# Patient Record
Sex: Female | Born: 1960
Health system: Southern US, Community
[De-identification: ages and names within clinical notes are randomized; demographics above are authoritative.]

## PROBLEM LIST (undated history)

## (undated) DIAGNOSIS — E785 Hyperlipidemia, unspecified: Secondary | ICD-10-CM

## (undated) DIAGNOSIS — F419 Anxiety disorder, unspecified: Secondary | ICD-10-CM

## (undated) DIAGNOSIS — I10 Essential (primary) hypertension: Secondary | ICD-10-CM

## (undated) DIAGNOSIS — E119 Type 2 diabetes mellitus without complications: Secondary | ICD-10-CM

## (undated) HISTORY — DX: Hyperlipidemia, unspecified: E78.5

## (undated) HISTORY — DX: Type 2 diabetes mellitus without complications: E11.9

## (undated) HISTORY — DX: Essential (primary) hypertension: I10

## (undated) HISTORY — DX: Anxiety disorder, unspecified: F41.9

---

## 2018-04-28 DIAGNOSIS — Z1231 Encounter for screening mammogram for malignant neoplasm of breast: Secondary | ICD-10-CM | POA: Diagnosis not present

## 2018-05-10 ENCOUNTER — Other Ambulatory Visit: Payer: Self-pay | Admitting: Occupational Medicine

## 2018-05-10 ENCOUNTER — Ambulatory Visit: Payer: Self-pay

## 2018-05-10 DIAGNOSIS — M79641 Pain in right hand: Secondary | ICD-10-CM

## 2018-05-13 MED FILL — LISINOPRIL-HCTZ 20-12.5 MG: 20-12.5 | 90 days supply | Qty: 180 | Fill #0

## 2018-05-13 MED FILL — PROPRANOLOL 40 MG TABLET: 40 | 90 days supply | Qty: 270 | Fill #0

## 2018-05-13 MED FILL — metFORMIN HCL 500 MG TABS: 500 | 90 days supply | Qty: 90 | Fill #0

## 2018-06-13 DIAGNOSIS — R6881 Early satiety: Secondary | ICD-10-CM | POA: Diagnosis not present

## 2018-06-13 DIAGNOSIS — R1310 Dysphagia, unspecified: Secondary | ICD-10-CM | POA: Diagnosis not present

## 2018-06-13 DIAGNOSIS — K219 Gastro-esophageal reflux disease without esophagitis: Secondary | ICD-10-CM | POA: Diagnosis not present

## 2018-06-13 MED FILL — OMEPRAZOLE 20 MG CPDR: 20 | 30 days supply | Qty: 30 | Fill #0

## 2018-06-21 DIAGNOSIS — R1310 Dysphagia, unspecified: Secondary | ICD-10-CM | POA: Diagnosis not present

## 2018-06-21 DIAGNOSIS — Z800 Family history of malignant neoplasm of digestive organs: Secondary | ICD-10-CM | POA: Diagnosis not present

## 2018-07-18 MED FILL — OMEPRAZOLE 20 MG CPDR: 20 | 30 days supply | Qty: 30 | Fill #1

## 2018-07-28 DIAGNOSIS — R072 Precordial pain: Secondary | ICD-10-CM | POA: Diagnosis not present

## 2018-07-28 DIAGNOSIS — I10 Essential (primary) hypertension: Secondary | ICD-10-CM | POA: Diagnosis not present

## 2018-07-28 DIAGNOSIS — E119 Type 2 diabetes mellitus without complications: Secondary | ICD-10-CM | POA: Diagnosis not present

## 2018-07-28 DIAGNOSIS — E785 Hyperlipidemia, unspecified: Secondary | ICD-10-CM | POA: Diagnosis not present

## 2018-07-28 DIAGNOSIS — D509 Iron deficiency anemia, unspecified: Secondary | ICD-10-CM | POA: Diagnosis not present

## 2018-07-28 DIAGNOSIS — E8881 Metabolic syndrome: Secondary | ICD-10-CM | POA: Diagnosis not present

## 2018-08-12 MED FILL — ATORVASTATIN 80 MG TABLET: 80 | 90 days supply | Qty: 90 | Fill #0

## 2018-08-12 MED FILL — LISINOPRIL-HCTZ 20-12.5 MG: 20-12.5 | 90 days supply | Qty: 180 | Fill #0

## 2018-08-12 MED FILL — metFORMIN HCL 500 MG TABS: 500 | 30 days supply | Qty: 30 | Fill #1

## 2018-09-19 MED FILL — metFORMIN HCL 500 MG TABS: 500 | 90 days supply | Qty: 90 | Fill #0

## 2018-11-04 MED FILL — PROPRANOLOL 40 MG TABLET: 40 | 90 days supply | Qty: 270 | Fill #0

## 2018-11-04 MED FILL — LISINOPRIL-HCTZ 20-12.5 MG: 20-12.5 | 90 days supply | Qty: 180 | Fill #0

## 2018-11-16 DIAGNOSIS — E1165 Type 2 diabetes mellitus with hyperglycemia: Secondary | ICD-10-CM | POA: Diagnosis not present

## 2018-11-16 DIAGNOSIS — E119 Type 2 diabetes mellitus without complications: Secondary | ICD-10-CM | POA: Diagnosis not present

## 2018-11-16 DIAGNOSIS — E7800 Pure hypercholesterolemia, unspecified: Secondary | ICD-10-CM | POA: Diagnosis not present

## 2018-11-16 DIAGNOSIS — Z79899 Other long term (current) drug therapy: Secondary | ICD-10-CM | POA: Diagnosis not present

## 2018-11-16 DIAGNOSIS — R5383 Other fatigue: Secondary | ICD-10-CM | POA: Diagnosis not present

## 2018-11-16 DIAGNOSIS — I10 Essential (primary) hypertension: Secondary | ICD-10-CM | POA: Diagnosis not present

## 2018-11-16 DIAGNOSIS — E559 Vitamin D deficiency, unspecified: Secondary | ICD-10-CM | POA: Diagnosis not present

## 2018-11-16 DIAGNOSIS — R0602 Shortness of breath: Secondary | ICD-10-CM | POA: Diagnosis not present

## 2018-11-16 MED FILL — PHENTERMINE 37.5 MG TABLET: 37.5 | 30 days supply | Qty: 30 | Fill #0

## 2018-11-24 DIAGNOSIS — Z6838 Body mass index (BMI) 38.0-38.9, adult: Secondary | ICD-10-CM | POA: Diagnosis not present

## 2018-11-24 DIAGNOSIS — E669 Obesity, unspecified: Secondary | ICD-10-CM | POA: Diagnosis not present

## 2018-11-24 DIAGNOSIS — N183 Chronic kidney disease, stage 3 (moderate): Secondary | ICD-10-CM | POA: Diagnosis not present

## 2018-11-24 DIAGNOSIS — E1165 Type 2 diabetes mellitus with hyperglycemia: Secondary | ICD-10-CM | POA: Diagnosis not present

## 2018-11-24 MED FILL — metFORMIN HCL 500 MG TABS: 500 | 30 days supply | Qty: 60 | Fill #0

## 2018-12-07 DIAGNOSIS — M62838 Other muscle spasm: Secondary | ICD-10-CM | POA: Diagnosis not present

## 2018-12-07 DIAGNOSIS — I10 Essential (primary) hypertension: Secondary | ICD-10-CM | POA: Diagnosis not present

## 2018-12-07 MED FILL — CYCLOBENZAPRINE HCL 10 MG T: 10 | 10 days supply | Qty: 20 | Fill #0

## 2018-12-14 DIAGNOSIS — E1165 Type 2 diabetes mellitus with hyperglycemia: Secondary | ICD-10-CM | POA: Diagnosis not present

## 2018-12-14 DIAGNOSIS — N183 Chronic kidney disease, stage 3 (moderate): Secondary | ICD-10-CM | POA: Diagnosis not present

## 2018-12-14 DIAGNOSIS — K5900 Constipation, unspecified: Secondary | ICD-10-CM | POA: Diagnosis not present

## 2018-12-14 DIAGNOSIS — I10 Essential (primary) hypertension: Secondary | ICD-10-CM | POA: Diagnosis not present

## 2018-12-14 MED FILL — PHENTERMINE 37.5 MG TABLET: 37.5 | 30 days supply | Qty: 30 | Fill #0

## 2018-12-18 MED FILL — metFORMIN HCL 500 MG TABS: 500 | 30 days supply | Qty: 60 | Fill #0

## 2018-12-22 DIAGNOSIS — E1165 Type 2 diabetes mellitus with hyperglycemia: Secondary | ICD-10-CM | POA: Diagnosis not present

## 2018-12-22 DIAGNOSIS — E7800 Pure hypercholesterolemia, unspecified: Secondary | ICD-10-CM | POA: Diagnosis not present

## 2018-12-22 DIAGNOSIS — I10 Essential (primary) hypertension: Secondary | ICD-10-CM | POA: Diagnosis not present

## 2018-12-22 DIAGNOSIS — Z6837 Body mass index (BMI) 37.0-37.9, adult: Secondary | ICD-10-CM | POA: Diagnosis not present

## 2019-01-05 DIAGNOSIS — N63 Unspecified lump in unspecified breast: Secondary | ICD-10-CM | POA: Diagnosis not present

## 2019-01-17 DIAGNOSIS — Z6838 Body mass index (BMI) 38.0-38.9, adult: Secondary | ICD-10-CM | POA: Diagnosis not present

## 2019-01-17 DIAGNOSIS — E1165 Type 2 diabetes mellitus with hyperglycemia: Secondary | ICD-10-CM | POA: Diagnosis not present

## 2019-01-17 DIAGNOSIS — E7800 Pure hypercholesterolemia, unspecified: Secondary | ICD-10-CM | POA: Diagnosis not present

## 2019-01-17 DIAGNOSIS — E669 Obesity, unspecified: Secondary | ICD-10-CM | POA: Diagnosis not present

## 2019-01-17 MED FILL — metFORMIN HCL 500 MG TABS: 500 | 30 days supply | Qty: 60 | Fill #0

## 2019-01-17 MED FILL — PHENTERMINE 37.5 MG TABLET: 37.5 | 30 days supply | Qty: 30 | Fill #0

## 2019-02-15 DIAGNOSIS — I10 Essential (primary) hypertension: Secondary | ICD-10-CM | POA: Diagnosis not present

## 2019-02-15 DIAGNOSIS — E1165 Type 2 diabetes mellitus with hyperglycemia: Secondary | ICD-10-CM | POA: Diagnosis not present

## 2019-02-15 DIAGNOSIS — Z79899 Other long term (current) drug therapy: Secondary | ICD-10-CM | POA: Diagnosis not present

## 2019-02-15 DIAGNOSIS — E669 Obesity, unspecified: Secondary | ICD-10-CM | POA: Diagnosis not present

## 2019-02-15 MED FILL — LISINOPRIL-HCTZ 20-12.5 MG: 20-12.5 | 60 days supply | Qty: 120 | Fill #0

## 2019-02-15 MED FILL — PHENTERMINE 37.5 MG TABLET: 37.5 | 30 days supply | Qty: 30 | Fill #0

## 2019-02-23 DIAGNOSIS — G43909 Migraine, unspecified, not intractable, without status migrainosus: Secondary | ICD-10-CM | POA: Diagnosis not present

## 2019-02-23 DIAGNOSIS — E7800 Pure hypercholesterolemia, unspecified: Secondary | ICD-10-CM | POA: Diagnosis not present

## 2019-02-23 DIAGNOSIS — E1165 Type 2 diabetes mellitus with hyperglycemia: Secondary | ICD-10-CM | POA: Diagnosis not present

## 2019-02-23 DIAGNOSIS — I10 Essential (primary) hypertension: Secondary | ICD-10-CM | POA: Diagnosis not present

## 2019-02-28 MED FILL — TOPIRAMATE 25 MG TAB: 25 | 42 days supply | Qty: 84 | Fill #0

## 2019-03-20 MED FILL — PROPRANOLOL 40 MG TABLET: 40 | 30 days supply | Qty: 90 | Fill #0

## 2019-04-17 MED FILL — LISINOPRIL-HCTZ 20-12.5 MG: 20-12.5 | 90 days supply | Qty: 180 | Fill #0

## 2019-04-18 MED FILL — ATORVASTATIN 80 MG TABLET: 80 | 90 days supply | Qty: 90 | Fill #0

## 2019-04-24 DIAGNOSIS — I10 Essential (primary) hypertension: Secondary | ICD-10-CM | POA: Diagnosis not present

## 2019-04-24 DIAGNOSIS — M25511 Pain in right shoulder: Secondary | ICD-10-CM | POA: Diagnosis not present

## 2019-04-24 DIAGNOSIS — E1169 Type 2 diabetes mellitus with other specified complication: Secondary | ICD-10-CM | POA: Diagnosis not present

## 2019-04-24 DIAGNOSIS — E785 Hyperlipidemia, unspecified: Secondary | ICD-10-CM | POA: Diagnosis not present

## 2019-04-24 DIAGNOSIS — E8881 Metabolic syndrome: Secondary | ICD-10-CM | POA: Diagnosis not present

## 2019-04-24 DIAGNOSIS — E669 Obesity, unspecified: Secondary | ICD-10-CM | POA: Diagnosis not present

## 2019-04-24 DIAGNOSIS — Z0000 Encounter for general adult medical examination without abnormal findings: Secondary | ICD-10-CM | POA: Diagnosis not present

## 2019-04-24 DIAGNOSIS — E119 Type 2 diabetes mellitus without complications: Secondary | ICD-10-CM | POA: Diagnosis not present

## 2019-04-24 DIAGNOSIS — D509 Iron deficiency anemia, unspecified: Secondary | ICD-10-CM | POA: Diagnosis not present

## 2019-04-25 DIAGNOSIS — Z7183 Encounter for nonprocreative genetic counseling: Secondary | ICD-10-CM | POA: Diagnosis not present

## 2019-04-25 DIAGNOSIS — Z800 Family history of malignant neoplasm of digestive organs: Secondary | ICD-10-CM | POA: Diagnosis not present

## 2019-04-25 DIAGNOSIS — Z8041 Family history of malignant neoplasm of ovary: Secondary | ICD-10-CM | POA: Diagnosis not present

## 2019-04-25 DIAGNOSIS — Z803 Family history of malignant neoplasm of breast: Secondary | ICD-10-CM | POA: Diagnosis not present

## 2019-05-04 DIAGNOSIS — M24811 Other specific joint derangements of right shoulder, not elsewhere classified: Secondary | ICD-10-CM | POA: Diagnosis not present

## 2019-05-04 MED FILL — PROPRANOLOL 40 MG TABLET: 40 | 30 days supply | Qty: 90 | Fill #0

## 2019-05-04 MED FILL — metFORMIN HCL 500 MG TABS: 500 | 30 days supply | Qty: 60 | Fill #0

## 2019-05-17 ENCOUNTER — Other Ambulatory Visit (HOSPITAL_COMMUNITY): Payer: Self-pay | Admitting: Orthopedic Surgery

## 2019-05-17 ENCOUNTER — Other Ambulatory Visit: Payer: Self-pay | Admitting: Orthopedic Surgery

## 2019-05-17 DIAGNOSIS — M24811 Other specific joint derangements of right shoulder, not elsewhere classified: Secondary | ICD-10-CM

## 2019-05-21 ENCOUNTER — Ambulatory Visit (HOSPITAL_COMMUNITY)
Admission: RE | Admit: 2019-05-21 | Discharge: 2019-05-21 | Disposition: A | Discharge status: Home / Self Care | Payer: 59 | Source: Ambulatory Visit | Attending: Orthopedic Surgery | Admitting: Orthopedic Surgery

## 2019-05-21 ENCOUNTER — Other Ambulatory Visit: Payer: Self-pay

## 2019-05-21 DIAGNOSIS — M24811 Other specific joint derangements of right shoulder, not elsewhere classified: Secondary | ICD-10-CM | POA: Diagnosis not present

## 2019-05-21 DIAGNOSIS — M25511 Pain in right shoulder: Secondary | ICD-10-CM | POA: Diagnosis not present

## 2019-05-23 ENCOUNTER — Other Ambulatory Visit: Payer: Self-pay | Admitting: Orthopedic Surgery

## 2019-05-23 ENCOUNTER — Other Ambulatory Visit (HOSPITAL_COMMUNITY): Payer: Self-pay | Admitting: Orthopedic Surgery

## 2019-05-23 DIAGNOSIS — M24811 Other specific joint derangements of right shoulder, not elsewhere classified: Secondary | ICD-10-CM

## 2019-05-23 DIAGNOSIS — R4589 Other symptoms and signs involving emotional state: Secondary | ICD-10-CM | POA: Diagnosis not present

## 2019-05-23 DIAGNOSIS — F419 Anxiety disorder, unspecified: Secondary | ICD-10-CM | POA: Diagnosis not present

## 2019-06-15 DIAGNOSIS — M75101 Unspecified rotator cuff tear or rupture of right shoulder, not specified as traumatic: Secondary | ICD-10-CM | POA: Diagnosis not present

## 2019-06-23 DIAGNOSIS — Z1231 Encounter for screening mammogram for malignant neoplasm of breast: Secondary | ICD-10-CM | POA: Diagnosis not present

## 2019-07-11 MED FILL — metFORMIN HCL 500 MG TABS: 500 | 30 days supply | Qty: 60 | Fill #0

## 2019-07-14 DIAGNOSIS — Z20828 Contact with and (suspected) exposure to other viral communicable diseases: Secondary | ICD-10-CM | POA: Diagnosis not present

## 2019-07-14 MED FILL — LISINOPRIL-HCTZ 20-12.5 MG: 20-12.5 | 90 days supply | Qty: 180 | Fill #0

## 2019-07-15 DIAGNOSIS — Z20828 Contact with and (suspected) exposure to other viral communicable diseases: Secondary | ICD-10-CM | POA: Diagnosis not present

## 2019-07-17 DIAGNOSIS — E8881 Metabolic syndrome: Secondary | ICD-10-CM | POA: Diagnosis not present

## 2019-07-17 DIAGNOSIS — E785 Hyperlipidemia, unspecified: Secondary | ICD-10-CM | POA: Diagnosis not present

## 2019-07-17 DIAGNOSIS — J1289 Other viral pneumonia: Secondary | ICD-10-CM | POA: Diagnosis not present

## 2019-07-17 DIAGNOSIS — Z7984 Long term (current) use of oral hypoglycemic drugs: Secondary | ICD-10-CM | POA: Diagnosis not present

## 2019-07-17 DIAGNOSIS — U071 COVID-19: Secondary | ICD-10-CM | POA: Diagnosis not present

## 2019-07-17 DIAGNOSIS — R918 Other nonspecific abnormal finding of lung field: Secondary | ICD-10-CM | POA: Diagnosis not present

## 2019-07-17 DIAGNOSIS — R0902 Hypoxemia: Secondary | ICD-10-CM | POA: Diagnosis not present

## 2019-07-17 DIAGNOSIS — I10 Essential (primary) hypertension: Secondary | ICD-10-CM | POA: Diagnosis not present

## 2019-07-17 DIAGNOSIS — E119 Type 2 diabetes mellitus without complications: Secondary | ICD-10-CM | POA: Diagnosis not present

## 2019-07-17 DIAGNOSIS — E782 Mixed hyperlipidemia: Secondary | ICD-10-CM | POA: Diagnosis not present

## 2019-07-17 DIAGNOSIS — N179 Acute kidney failure, unspecified: Secondary | ICD-10-CM | POA: Diagnosis not present

## 2019-07-17 DIAGNOSIS — Z6836 Body mass index (BMI) 36.0-36.9, adult: Secondary | ICD-10-CM | POA: Diagnosis not present

## 2019-07-17 DIAGNOSIS — E1169 Type 2 diabetes mellitus with other specified complication: Secondary | ICD-10-CM | POA: Diagnosis not present

## 2019-07-17 DIAGNOSIS — D649 Anemia, unspecified: Secondary | ICD-10-CM | POA: Diagnosis not present

## 2019-07-17 DIAGNOSIS — I059 Rheumatic mitral valve disease, unspecified: Secondary | ICD-10-CM | POA: Diagnosis not present

## 2019-07-17 DIAGNOSIS — R0602 Shortness of breath: Secondary | ICD-10-CM | POA: Diagnosis not present

## 2019-07-26 ENCOUNTER — Other Ambulatory Visit: Payer: Self-pay | Admitting: *Deleted

## 2019-07-26 ENCOUNTER — Encounter: Payer: Self-pay | Admitting: *Deleted

## 2019-07-26 DIAGNOSIS — E119 Type 2 diabetes mellitus without complications: Secondary | ICD-10-CM | POA: Insufficient documentation

## 2019-07-26 DIAGNOSIS — I10 Essential (primary) hypertension: Secondary | ICD-10-CM | POA: Insufficient documentation

## 2019-07-26 DIAGNOSIS — U071 COVID-19: Secondary | ICD-10-CM | POA: Insufficient documentation

## 2019-07-26 NOTE — Patient Outreach (Signed)
[facility]) Care Management  07/26/2019  Chelsea Chapman 12-19-1960 [id number]  Transition of care call/case closure   Referral received:07/25/19 Initial outreach:07/26/19 Insurance: [employer] [employer]    Subjective: Initial successful telephone call to patient's preferred number in order to complete transition of care assessment; 2 HIPAA identifiers verified. Explained purpose of call and completed transition of care assessment.  Chelsea Chapman reports that she is feeling much better and getting there with her strength and denies having muscle aches as she experienced prior to admission.  She denies having shortness of breath speaking in complete sentences, states only occasional cough, she does not have oxygen saturation monitor to check oxygen level, she denies having a fever. She discussed not having good sense of taste and smell, slowly getting appetite back, reports drinking plenty fluids, tolerating diet, denies diarrhea or nausea problems . Chelsea Chapman lives at home alone, her family is supportive, she just calls them is she needs something and they will bring it by and hang it on the door as she is still on quarantine until 12/6. She discussed having positive test on 12/19 after close contact with her mother that was also hospitalized.   Discussed assessing [facility] benefits: .  She discussed ongoing health issue with hypertension and Diabetes ( A1c 6.3% 04/24/19), being under pretty good control. She is agreeable to enrolling in [facility] chronic condition program after being explained. She monitors her blood pressure at home, recent reading of 100/60 yesterday, she notes no longer being on lisinopril at discharge, will follow up with PCP on next week,encouraged to keep a record of readings to take to visit. Patient reports not monitoring blood sugars at home at this time, stating when she was on another insurance plan she had been prescribed, freestyle libre with 14 day sensor, and  current insurance not covering, discussed freestyle lite meter , with finger sticks, she plans to ask provider at visit for prescription.  She does  not have the hospital indemnity, she has been in contact with [employer] regarding leave of absence and is awaiting a return call. She discussed question regarding benefits and billing questions  provided [place] benefits office number at [phone] as well as Billing office at [phone]. She also expressed concern regarding cost in seeing out of network providers that she prefers to see closer to area that she lives, understands difference in network and out of network, she plans to call number on back of her insurance card to discuss.  She does use a [place] pharmacy, [facility]. .  She denies educational needs related to staying safe during the COVID 19 pandemic.    Objective:  Chelsea Chapman  was hospitalized at [place] 19, fever, chills, body aches, hypoxia   From 12/21-12-24/20 Comorbidities include: Diabetes type 2, Hypertension, Hyperlipidemia.  She was discharged to home on 07/20/19  without the need for home health services or DME.   Assessment:  Patient voices good understanding of all discharge instructions.  See transition of care flowsheet for assessment details.   Plan:  Reviewed hospital discharge diagnosis of Covid 19  and discharge treatment plan using hospital discharge instructions, assessing medication adherence, reviewing problems requiring provider notification, and discussing the importance of follow up with surgeon, primary care provider and/or specialists as directed. Reviewed [place] healthy lifestyle program information to keep premiums low for 2022:  Step 1: Get annual physical between July 27, 2018 and January 25, 2020; Step 2: Complete your health  assessment between July 28, 2019 and March 27, 2020 at [email or web address] Step 3:Identify your current  health status and complete the corresponding action step between January 1, and March 27, 2020.   Using [employer] website,enrolled patient as she agreed to participate  in [facility] Active Health Management chronic disease management program.    No ongoing care management needs identified so will close case to [facility] Care Management services and route successful outreach letter with [facility] Care Management pamphlet and 24 Hour Nurse Line Magnet to [employer] Care Management clinical pool to be mailed to patient's home address. Thanked patient for their services to [facility].   Egbert Garibaldi, RN, PCCN Alumnus  [facility] Care Management,Care Management Coordinator  [phone]- Mobile [phone]- Toll Free Main Office

## 2019-08-03 DIAGNOSIS — Z09 Encounter for follow-up examination after completed treatment for conditions other than malignant neoplasm: Secondary | ICD-10-CM | POA: Diagnosis not present

## 2019-08-03 DIAGNOSIS — I10 Essential (primary) hypertension: Secondary | ICD-10-CM | POA: Diagnosis not present

## 2019-08-03 DIAGNOSIS — R9389 Abnormal findings on diagnostic imaging of other specified body structures: Secondary | ICD-10-CM | POA: Diagnosis not present

## 2019-08-14 MED FILL — ATORVASTATIN 80 MG TABLET: 80 | 90 days supply | Qty: 90 | Fill #1

## 2019-08-16 MED FILL — PROPRANOLOL 40 MG TABLET: 40 | 30 days supply | Qty: 90 | Fill #0

## 2019-09-22 MED FILL — metFORMIN HCL 500 MG TABS: 500 | 60 days supply | Qty: 60 | Fill #0

## 2019-09-25 DIAGNOSIS — D509 Iron deficiency anemia, unspecified: Secondary | ICD-10-CM | POA: Diagnosis not present

## 2019-09-25 DIAGNOSIS — E8881 Metabolic syndrome: Secondary | ICD-10-CM | POA: Diagnosis not present

## 2019-09-25 DIAGNOSIS — E1165 Type 2 diabetes mellitus with hyperglycemia: Secondary | ICD-10-CM | POA: Diagnosis not present

## 2019-09-25 DIAGNOSIS — I10 Essential (primary) hypertension: Secondary | ICD-10-CM | POA: Diagnosis not present

## 2019-09-25 DIAGNOSIS — Z6836 Body mass index (BMI) 36.0-36.9, adult: Secondary | ICD-10-CM | POA: Diagnosis not present

## 2019-09-25 DIAGNOSIS — E782 Mixed hyperlipidemia: Secondary | ICD-10-CM | POA: Diagnosis not present

## 2019-09-25 MED FILL — PROPRANOLOL 40 MG TABLET: 40 | 30 days supply | Qty: 90 | Fill #0

## 2019-09-25 MED FILL — OMEPRAZOLE DR 20 MG CAPSULE: 20 | 30 days supply | Qty: 30 | Fill #0

## 2019-10-30 MED FILL — PROPRANOLOL 40 MG TABLET: 40 | 30 days supply | Qty: 90 | Fill #1

## 2019-11-30 MED FILL — metFORMIN HCL 500 MG TABS: 500 | 60 days supply | Qty: 60 | Fill #1

## 2020-10-10 IMAGING — DX DG HAND COMPLETE 3+V*R*
3 series · 3 of 3 positions shown · non-contrast
Comparison: None.

CLINICAL DATA: Blunt trauma with pain and swelling, initial
encounter

EXAM:
RIGHT HAND - COMPLETE 3+ VIEW

[hand pa]
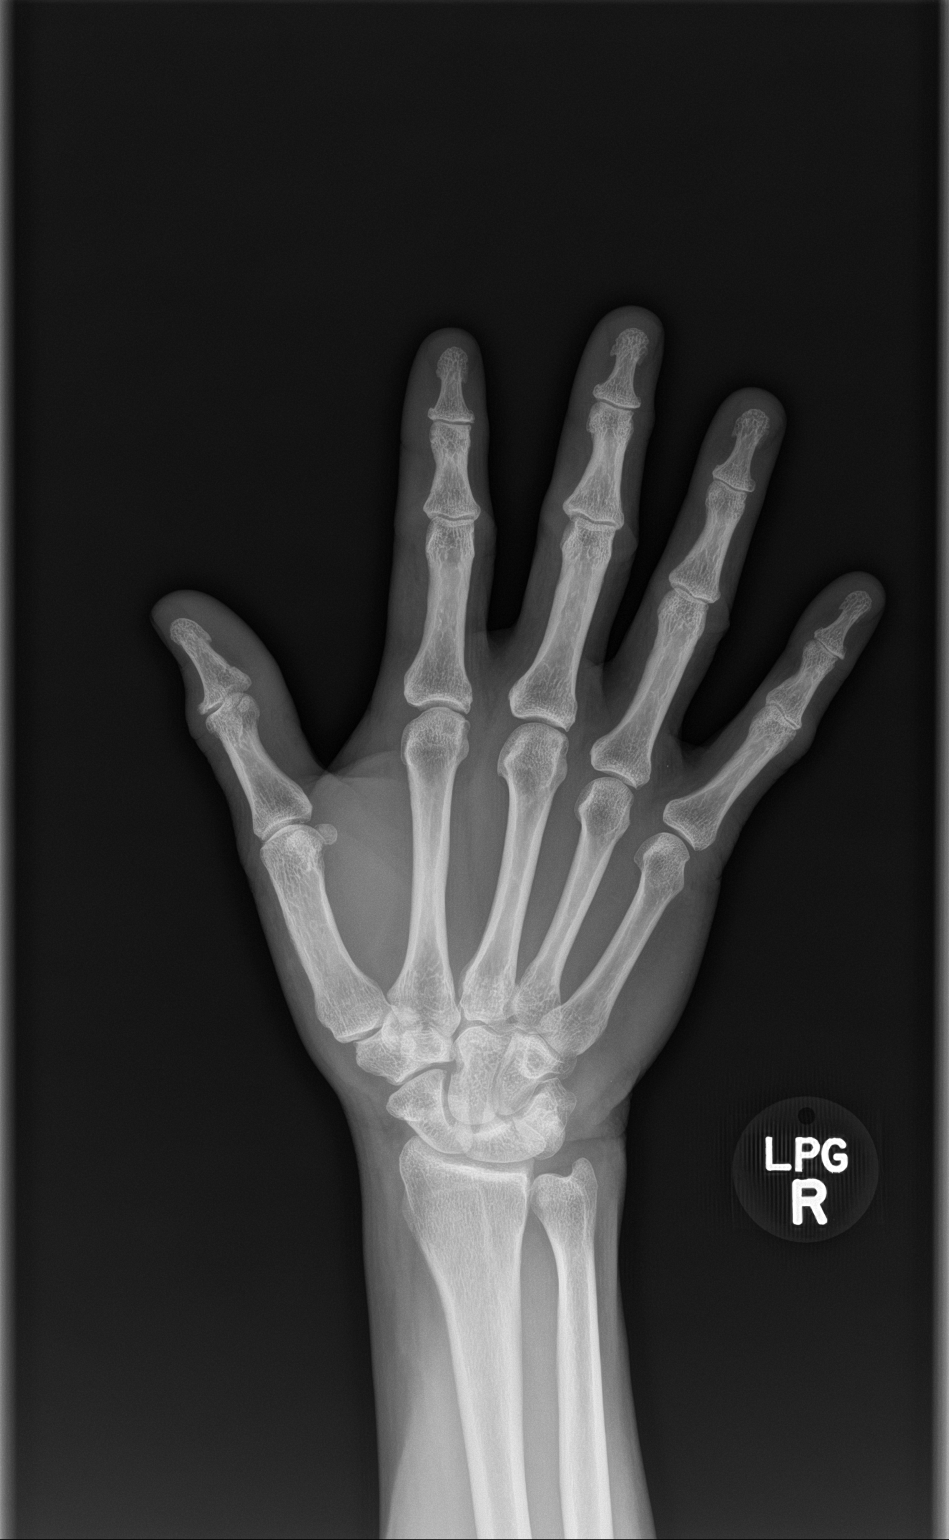

[hand obl]
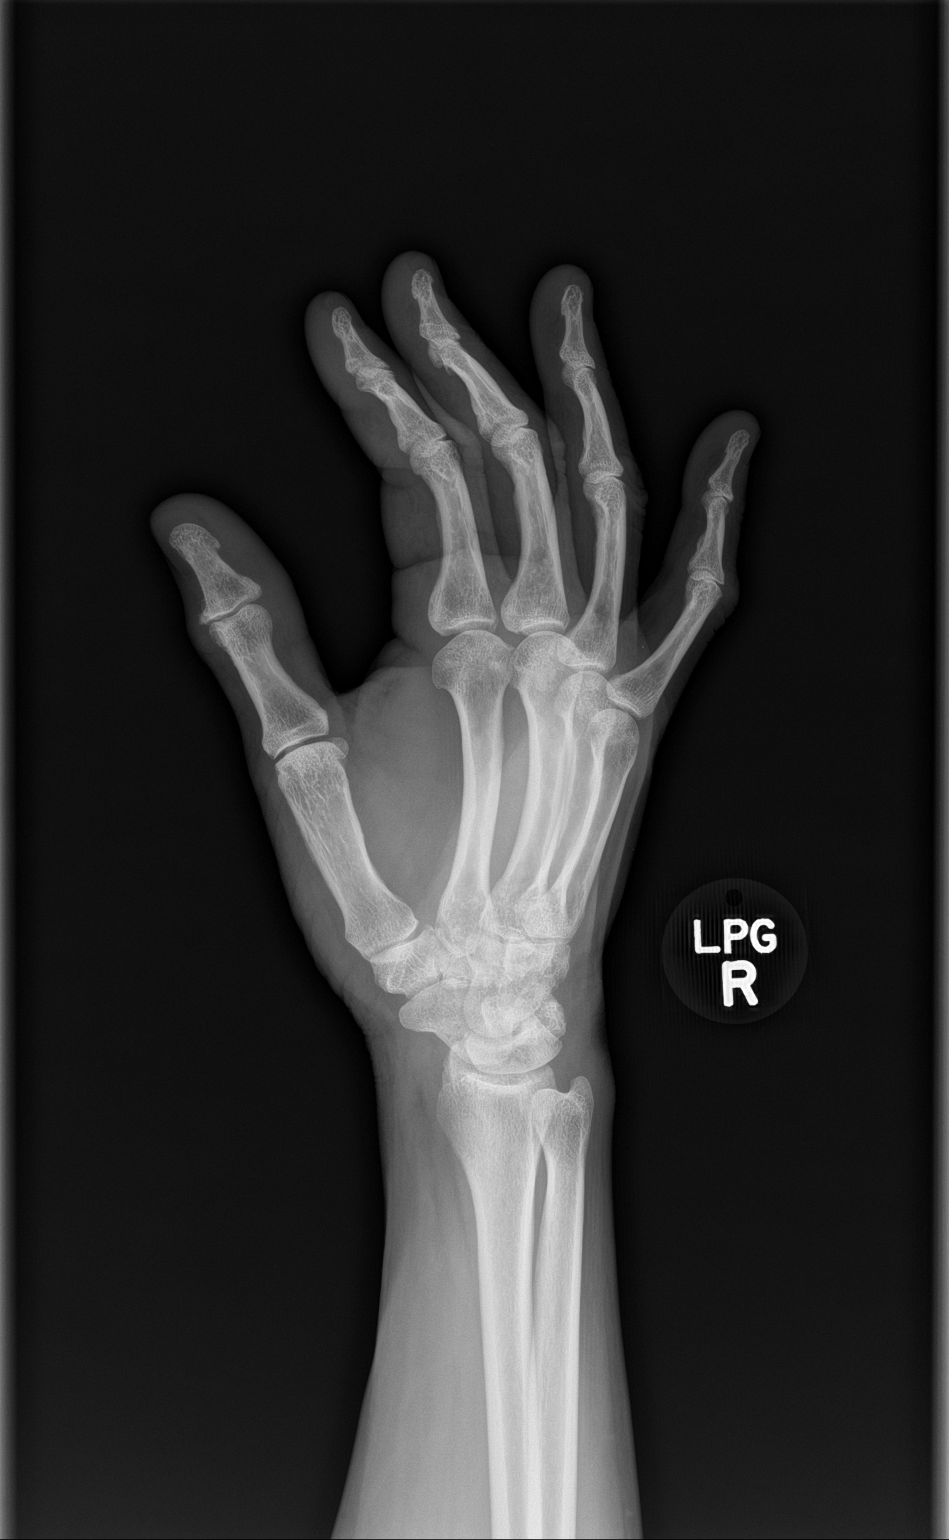

[hand lat]
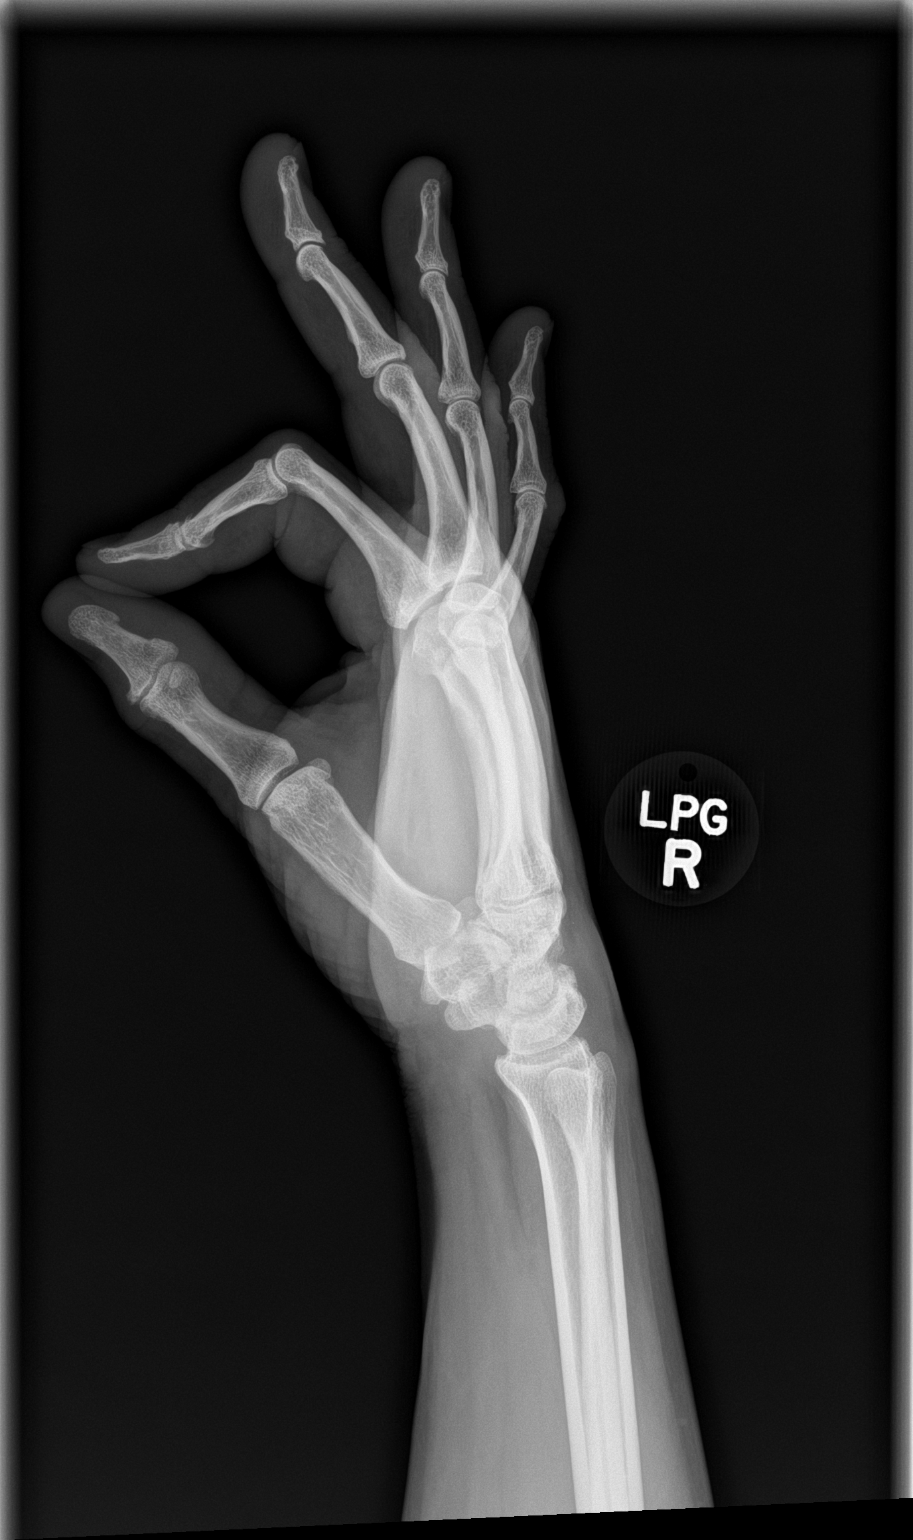

[3 of 3 positions shown; findings below may reference images not displayed]

FINDINGS: Mild soft tissue swelling is noted. No acute fracture or dislocation
is noted. No other focal abnormality is seen.
IMPRESSION: Soft tissue swelling without acute bony abnormality.
